# Patient Record
Sex: Male | Born: 2005 | Race: Black or African American | Hispanic: No | Marital: Single | Smoking: Never smoker
Health system: Southern US, Community
[De-identification: ages and names within clinical notes are randomized; demographics above are authoritative.]

## PROBLEM LIST (undated history)

## (undated) DIAGNOSIS — S060X9A Concussion with loss of consciousness of unspecified duration, initial encounter: Secondary | ICD-10-CM

## (undated) DIAGNOSIS — S060XAA Concussion with loss of consciousness status unknown, initial encounter: Secondary | ICD-10-CM

---
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
## 2024-03-27 ENCOUNTER — Other Ambulatory Visit (HOSPITAL_BASED_OUTPATIENT_CLINIC_OR_DEPARTMENT_OTHER): Payer: Self-pay
